# Patient Record
Sex: Male | Born: 2000 | Race: Black or African American | Hispanic: No | Marital: Single | Smoking: Never smoker
Health system: Southern US, Community
[De-identification: ages and names within clinical notes are randomized; demographics above are authoritative.]

---

## 2019-01-19 ENCOUNTER — Encounter (HOSPITAL_COMMUNITY): Payer: Self-pay

## 2019-01-19 ENCOUNTER — Ambulatory Visit (INDEPENDENT_AMBULATORY_CARE_PROVIDER_SITE_OTHER): Payer: Managed Care, Other (non HMO)

## 2019-01-19 ENCOUNTER — Other Ambulatory Visit: Payer: Self-pay

## 2019-01-19 ENCOUNTER — Ambulatory Visit (HOSPITAL_COMMUNITY)
Admission: EM | Admit: 2019-01-19 | Discharge: 2019-01-19 | Disposition: A | Discharge status: Home / Self Care | Payer: Managed Care, Other (non HMO) | Attending: Family Medicine | Admitting: Family Medicine

## 2019-01-19 DIAGNOSIS — S60032A Contusion of left middle finger without damage to nail, initial encounter: Secondary | ICD-10-CM

## 2019-01-19 DIAGNOSIS — W231XXA Caught, crushed, jammed, or pinched between stationary objects, initial encounter: Secondary | ICD-10-CM | POA: Diagnosis not present

## 2019-01-19 DIAGNOSIS — S60042A Contusion of left ring finger without damage to nail, initial encounter: Secondary | ICD-10-CM | POA: Diagnosis not present

## 2019-01-19 MED ORDER — IBUPROFEN 800 MG PO TABS: 800.0000 mg | ORAL_TABLET | Freq: Three times a day (TID) | ORAL | 0 refills | Status: DC

## 2019-01-19 NOTE — ED Provider Notes (Signed)
This is the initial visit for this 18 year old man.   [facility]    CSN: [id number] Arrival date & time: 01/19/19  1147      History   Chief Complaint Chief Complaint  Patient presents with  . Finger Injury    HPI Jay Pitts is a 18 y.o. male.   This is the initial Jay Pitts urgent care visit for this 18 year old man.  Patient presents to Urgent Care with complaints of left third and fourth fingers since slamming them in a door yesterday. Patient reports he has not taken anything for pain, is able to move fingers distal to injury.   Patient works at [employer] and did shift last night.  His fingers hurt to move, and his work involves Agricultural engineer.       History reviewed. No pertinent past medical history.  There are no problems to display for this patient.   History reviewed. No pertinent surgical history.     Home Medications    Prior to Admission medications   Medication Sig Start Date End Date Taking? Authorizing Provider  ibuprofen (ADVIL) 800 MG tablet Take 1 tablet (800 mg total) by mouth 3 (three) times daily. 01/19/19   Robyn Haber, MD    Family History Family History  Problem Relation Age of Onset  . Healthy Mother   . Healthy Father     Social History Social History   Tobacco Use  . Smoking status: Never Smoker  . Smokeless tobacco: Never Used  Substance Use Topics  . Alcohol use: Not Currently  . Drug use: Yes    Types: Marijuana     Allergies   Patient has no known allergies.   Review of Systems Review of Systems  All other systems reviewed and are negative.    Physical Exam Triage Vital Signs ED Triage Vitals  Enc Vitals Group     BP 01/19/19 1232 135/65     Pulse Rate 01/19/19 1232 60     Resp 01/19/19 1232 16     Temp 01/19/19 1232 98.2 F (36.8 C)     Temp Source 01/19/19 1232 Oral     SpO2 01/19/19 1232 100 %     Weight --      Height --      Head Circumference --      Peak Flow --    Pain Score 01/19/19 1229 5     Pain Loc --      Pain Edu? --      Excl. in [place]? --    No data found.  Updated Vital Signs BP 135/65 (BP Location: Right Arm)   Pulse 60   Temp 98.2 F (36.8 C) (Oral)   Resp 16   SpO2 100%    Physical Exam Vitals and nursing note reviewed.  Constitutional:      General: He is not in acute distress.    Appearance: Normal appearance. He is normal weight. He is not ill-appearing.  HENT:     Head: Normocephalic.  Eyes:     Conjunctiva/sclera: Conjunctivae normal.  Pulmonary:     Effort: Pulmonary effort is normal.  Musculoskeletal:        General: Swelling, tenderness and signs of injury present. No deformity.     Cervical back: Normal range of motion and neck supple.     Comments: Mild swelling proximal phalanx of middle and ring finger on the left with very superficial abrasion, mild swelling, no ecchymosis.  He is not able  to bend his fingers more than 20 degrees because of pain.  Skin:    General: Skin is warm and dry.  Neurological:     General: No focal deficit present.     Mental Status: He is alert.  Psychiatric:        Mood and Affect: Mood normal.        Behavior: Behavior normal.      UC Treatments / Results  Labs (all labs ordered are listed, but only abnormal results are displayed) Labs Reviewed - No data to display  EKG   Radiology DG Hand Complete Left  Result Date: 01/19/2019 CLINICAL DATA:  Hand caught in car door EXAM: LEFT HAND - COMPLETE 3+ VIEW COMPARISON:  None. FINDINGS: Frontal, oblique, and lateral views were obtained. No fracture or dislocation. Joint spaces appear normal. No erosive change. IMPRESSION: No fracture or dislocation.  No evident arthropathy. Electronically Signed   By: Bretta Bang III M.D.   On: 01/19/2019 13:25    Procedures Procedures (including critical care time)  Medications Ordered in UC Medications - No data to display  Initial Impression / Assessment and Plan / UC Course    I have reviewed the triage vital signs and the nursing notes.  Pertinent labs & imaging results that were available during my care of the patient were reviewed by me and considered in my medical decision making (see chart for details).    Final Clinical Impressions(s) / UC Diagnoses   Final diagnoses:  Contusion of left middle finger without damage to nail, initial encounter   Discharge Instructions   None    ED Prescriptions    Medication Sig Dispense Auth. Provider   ibuprofen (ADVIL) 800 MG tablet Take 1 tablet (800 mg total) by mouth 3 (three) times daily. 21 tablet Elvina Sidle, MD     I have reviewed the PDMP during this encounter.   Elvina Sidle, MD 01/19/19 1330

## 2019-01-19 NOTE — ED Triage Notes (Signed)
Patient presents to Urgent Care with complaints of left third and fourth fingers since slamming them in a door yesterday. Patient reports he has not taken anything for pain, is able to move fingers distal to injury.

## 2019-02-18 ENCOUNTER — Other Ambulatory Visit: Payer: Self-pay

## 2019-02-18 ENCOUNTER — Ambulatory Visit (HOSPITAL_COMMUNITY)
Admission: EM | Admit: 2019-02-18 | Discharge: 2019-02-18 | Disposition: A | Discharge status: Home / Self Care | Payer: BC Managed Care – PPO | Attending: Family Medicine | Admitting: Family Medicine

## 2019-02-18 ENCOUNTER — Encounter (HOSPITAL_COMMUNITY): Payer: Self-pay

## 2019-02-18 ENCOUNTER — Ambulatory Visit (INDEPENDENT_AMBULATORY_CARE_PROVIDER_SITE_OTHER): Payer: BC Managed Care – PPO

## 2019-02-18 DIAGNOSIS — S0181XA Laceration without foreign body of other part of head, initial encounter: Secondary | ICD-10-CM

## 2019-02-18 DIAGNOSIS — M25571 Pain in right ankle and joints of right foot: Secondary | ICD-10-CM

## 2019-02-18 DIAGNOSIS — S161XXA Strain of muscle, fascia and tendon at neck level, initial encounter: Secondary | ICD-10-CM

## 2019-02-18 DIAGNOSIS — S93401A Sprain of unspecified ligament of right ankle, initial encounter: Secondary | ICD-10-CM

## 2019-02-18 DIAGNOSIS — R0789 Other chest pain: Secondary | ICD-10-CM

## 2019-02-18 MED ORDER — IBUPROFEN 800 MG PO TABS: 800.0000 mg | ORAL_TABLET | Freq: Three times a day (TID) | ORAL | 0 refills | Status: DC

## 2019-02-18 MED ORDER — TIZANIDINE HCL 4 MG PO TABS: 4.0000 mg | ORAL_TABLET | Freq: Four times a day (QID) | ORAL | 0 refills | Status: DC | PRN

## 2019-02-18 NOTE — Discharge Instructions (Addendum)
Take zanaflex 1-2 tablets by mouth every 6 hours as needed, muscle relaxer Take ibuprofen 1 tablet by mouth 3 times a day as needed for pain and inflammation. You will feel worse tomorrow, that is normal. If after a couple of days you still feel just as bad or worse, return here or go to the emergency room. Ice or heat to painful areas Activity as tolerated
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
## 2019-03-02 ENCOUNTER — Telehealth: Payer: Self-pay

## 2019-03-02 NOTE — Telephone Encounter (Signed)
Called patient to do their pre-visit COVID screening.  Call went to voicemail. Unable to do prescreening.  

## 2019-03-02 NOTE — Patient Instructions (Addendum)
Thank you for choosing Primary Care at [facility] to be your medical home!    Jay Pitts was seen by De Hollingshead, DO today.   Jay Pitts's primary care provider is Marcy Siren, DO.   For the best care possible, you should try to see Marcy Siren, DO whenever you come to the clinic.   We look forward to seeing you again soon!  If you have any questions about your visit today, please call us at [phone] or feel free to reach your primary care provider via MyChart.   Safe Sex Practicing safe sex means taking steps before and during sex to reduce your risk of:  Getting an STI (sexually transmitted infection).  Giving your partner an STI.  Unwanted or unplanned pregnancy. How can I practice safe sex?     Ways you can practice safe sex  Limit your sexual partners to only one partner who is having sex with only you.  Avoid using alcohol and drugs before having sex. Alcohol and drugs can affect your judgment.  Before having sex with a new partner: ? Talk to your partner about past partners, past STIs, and drug use. ? Get screened for STIs and discuss the results with your partner. Ask your partner to get screened, too.  Check your body regularly for sores, blisters, rashes, or unusual discharge. If you notice any of these problems, visit your health care provider.  Avoid sexual contact if you have symptoms of an infection or you are being treated for an STI.  While having sex, use a condom. Make sure to: ? Use a condom every time you have vaginal, oral, or anal sex. Both females and males should wear condoms during oral sex. ? Keep condoms in place from the beginning to the end of sexual activity. ? Use a latex condom, if possible. Latex condoms offer the best protection. ? Use only water-based lubricants with a condom. Using petroleum-based lubricants or oils will weaken the condom and increase the chance that it will break. Ways your health  care provider can help you practice safe sex  See your health care provider for regular screenings, exams, and tests for STIs.  Talk with your health care provider about what kind of birth control (contraception) is best for you.  Get vaccinated against hepatitis B and human papillomavirus (HPV).  If you are at risk of being infected with HIV (human immunodeficiency virus), talk with your health care provider about taking a prescription medicine to prevent HIV infection. You are at risk for HIV if you: ? Are a man who has sex with other men. ? Are sexually active with more than one partner. ? Take drugs by injection. ? Have a sex partner who has HIV. ? Have unprotected sex. ? Have sex with someone who has sex with both men and women. ? Have had an STI. Follow these instructions at home:  Take over-the-counter and prescription medicines as told by your health care provider.  Keep all follow-up visits as told by your health care provider. This is important. Where to find more information  Centers for Disease Control and Prevention: LessFurniture.be  Planned Parenthood: https://www.plannedparenthood.org/  Office on Women's Health: EmploymentTracking.tn Summary  Practicing safe sex means taking steps before and during sex to reduce your risk of STIs, giving your partner STIs, and having an unwanted or unplanned pregnancy.  Before having sex with a new partner, talk to your partner about past partners, past STIs, and drug use.  Use  a condom every time you have vaginal, oral, or anal sex. Both females and males should wear condoms during oral sex.  Check your body regularly for sores, blisters, rashes, or unusual discharge. If you notice any of these problems, visit your health care provider.  See your health care provider for regular screenings, exams, and tests for STIs. This information is not intended  to replace advice given to you by your health care provider. Make sure you discuss any questions you have with your health care provider. Document Revised: 05/07/2018 Document Reviewed: 10/26/2017 Elsevier Patient Education  [employer].

## 2019-03-03 ENCOUNTER — Other Ambulatory Visit: Payer: Self-pay

## 2019-03-03 ENCOUNTER — Encounter: Payer: Self-pay | Admitting: Internal Medicine

## 2019-03-03 ENCOUNTER — Ambulatory Visit (INDEPENDENT_AMBULATORY_CARE_PROVIDER_SITE_OTHER): Payer: BC Managed Care – PPO | Admitting: Internal Medicine

## 2019-03-03 VITALS — BP 138/69 | HR 93 | Temp 97.3°F | Resp 17 | Ht 64.0 in | Wt 162.0 lb

## 2019-03-03 DIAGNOSIS — Z7689 Persons encountering health services in other specified circumstances: Secondary | ICD-10-CM

## 2019-03-03 DIAGNOSIS — M79671 Pain in right foot: Secondary | ICD-10-CM | POA: Diagnosis not present

## 2019-03-03 DIAGNOSIS — Z114 Encounter for screening for human immunodeficiency virus [HIV]: Secondary | ICD-10-CM

## 2019-03-03 DIAGNOSIS — Z113 Encounter for screening for infections with a predominantly sexual mode of transmission: Secondary | ICD-10-CM
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
## 2019-03-04 LAB — RPR: RPR Ser Ql: NONREACTIVE

## 2019-03-04 LAB — HIV ANTIBODY (ROUTINE TESTING W REFLEX): HIV Screen 4th Generation wRfx: NONREACTIVE

## 2019-03-06 LAB — GC/CHLAMYDIA PROBE AMP
Chlamydia trachomatis, NAA: NEGATIVE
Neisseria Gonorrhoeae by PCR: NEGATIVE

## 2019-03-08 NOTE — Progress Notes (Signed)
Patient notified of results & recommendations. Expressed understanding.

## 2019-06-23 ENCOUNTER — Other Ambulatory Visit: Payer: Self-pay

## 2019-06-23 ENCOUNTER — Encounter (HOSPITAL_COMMUNITY): Payer: Self-pay | Admitting: Emergency Medicine

## 2019-06-23 ENCOUNTER — Ambulatory Visit (HOSPITAL_COMMUNITY)
Admission: EM | Admit: 2019-06-23 | Discharge: 2019-06-23 | Disposition: A | Discharge status: Home / Self Care | Payer: BC Managed Care – PPO | Attending: Physician Assistant | Admitting: Physician Assistant

## 2019-06-23 DIAGNOSIS — M722 Plantar fascial fibromatosis: Secondary | ICD-10-CM | POA: Diagnosis not present

## 2019-06-23 DIAGNOSIS — M7661 Achilles tendinitis, right leg: Secondary | ICD-10-CM

## 2019-06-23 MED ORDER — IBUPROFEN 800 MG PO TABS: 800.0000 mg | ORAL_TABLET | Freq: Three times a day (TID) | ORAL | 0 refills | Status: AC
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
## 2019-06-23 NOTE — Discharge Instructions (Signed)
Take the ibuprofen every 8 hours for the next 3 to 4 days and then as needed Ice your foot and utilize the techniques we discussed.  You should ice your foot after work every day.  Continue to use some of these exercises.  Follow-up with the sports medicine group as needed if your pain persists and is not improving with these treatments
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
## 2019-07-04 ENCOUNTER — Encounter: Payer: Self-pay | Admitting: Medical

## 2019-07-04 ENCOUNTER — Ambulatory Visit
Admission: RE | Admit: 2019-07-04 | Discharge: 2019-07-04 | Disposition: A | Discharge status: Home / Self Care | Payer: BC Managed Care – PPO | Source: Ambulatory Visit | Attending: Medical | Admitting: Medical

## 2019-07-04 ENCOUNTER — Other Ambulatory Visit: Payer: Self-pay

## 2019-07-04 ENCOUNTER — Ambulatory Visit: Payer: BC Managed Care – PPO | Admitting: Medical

## 2019-07-04 VITALS — BP 118/70 | HR 68 | Ht 64.0 in | Wt 166.0 lb

## 2019-07-04 DIAGNOSIS — M79671 Pain in right foot: Secondary | ICD-10-CM | POA: Diagnosis not present

## 2019-07-04 NOTE — Patient Instructions (Signed)
Please go to [facility] for your right foot xray.   Their hours are 8am - 4:30 pm Monday - Friday.  Take your insurance card with you.  [facility] [phone]  [address], [address] [place], [place] [address]  [address] [place], [place] [address]   Rest the foot or limit activity for now  Consider ice therapy such as bucket of cold water, 15-20 minute foot soak after work or up to twice daily  You can use Ibuprofen 200mg  over the counter 2 tablets twice daily for the next 7-10 days
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
## 2019-07-05 ENCOUNTER — Encounter: Payer: Self-pay | Admitting: Medical

## 2019-07-14 ENCOUNTER — Encounter: Payer: Self-pay | Admitting: Medical

## 2019-07-14 ENCOUNTER — Telehealth: Payer: Self-pay

## 2019-07-14 NOTE — Telephone Encounter (Signed)
Patient has requested a note with work restrictions as he does a lot of lifting at night. Patient would like light duty work while pm and he's there between the hours of 7pm and 3am. Please advise.

## 2019-07-14 NOTE — Telephone Encounter (Signed)
From last visit we advised 2 week f/u.   Lets schedule this.   I assume he has been using the CAM walker?  Can write light duty work note, no lifting over 20 lb, to cover from today until the recheck visit which should be within the next week

## 2019-07-14 NOTE — Telephone Encounter (Signed)
Appointment scheduled.

## 2019-07-21 ENCOUNTER — Ambulatory Visit: Payer: BC Managed Care – PPO | Admitting: Medical

## 2019-07-22 ENCOUNTER — Encounter: Payer: Self-pay | Admitting: Medical
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
## 2022-10-10 ENCOUNTER — Ambulatory Visit: Payer: BC Managed Care – PPO
